# Patient Record
Sex: Male | Born: 1995
Health system: Southern US, Community
[De-identification: ages and names within clinical notes are randomized; demographics above are authoritative.]

## PROBLEM LIST (undated history)

## (undated) DIAGNOSIS — T7840XA Allergy, unspecified, initial encounter: Secondary | ICD-10-CM

## (undated) DIAGNOSIS — J302 Other seasonal allergic rhinitis: Secondary | ICD-10-CM

## (undated) HISTORY — PX: DENTAL SURGERY: SHX609

## (undated) HISTORY — DX: Allergy, unspecified, initial encounter: T78.40XA

## (undated) HISTORY — DX: Other seasonal allergic rhinitis: J30.2

## (undated) HISTORY — PX: TYMPANOSTOMY TUBE PLACEMENT: SHX32

---
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
## 2016-01-03 IMAGING — CR DG FINGER MIDDLE 2+V*R*
3 series · 3 of 3 positions shown · non-contrast
Comparison: None.

CLINICAL DATA: Finger laceration secondary to crush injury.

EXAM:
RIGHT MIDDLE FINGER 2+V

[x finger pa right]
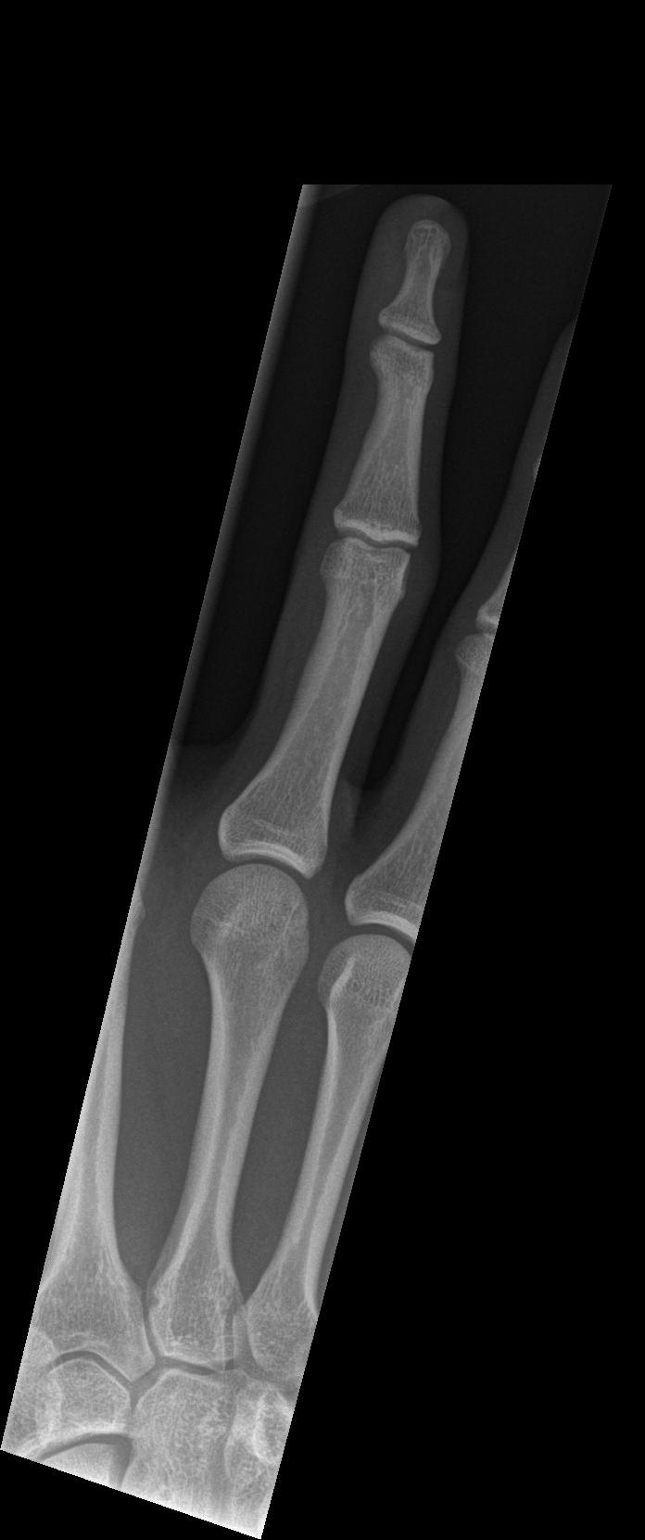

[x finger obl. right]
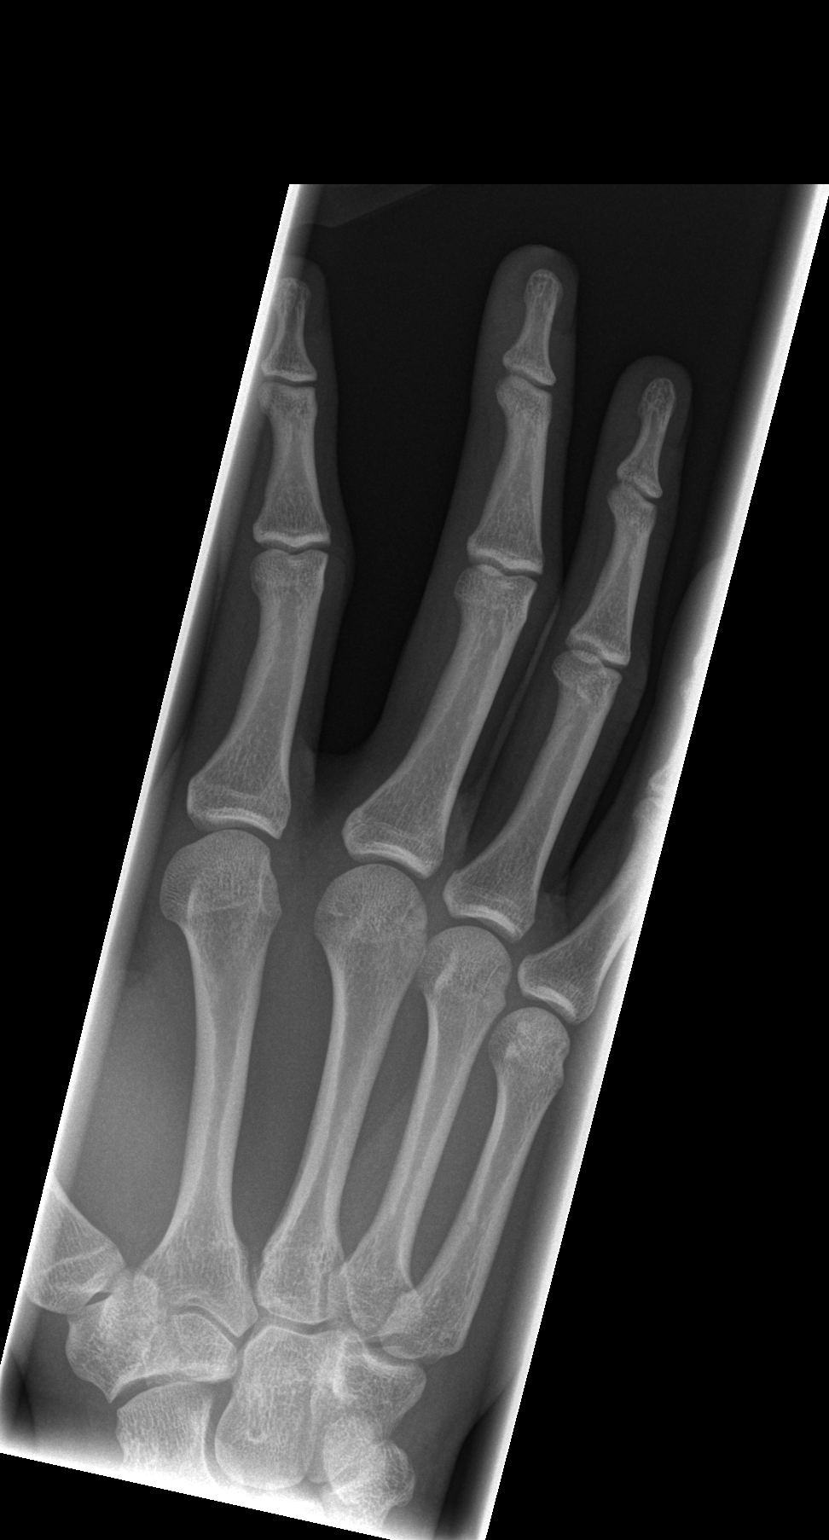

[x finger lateral right]
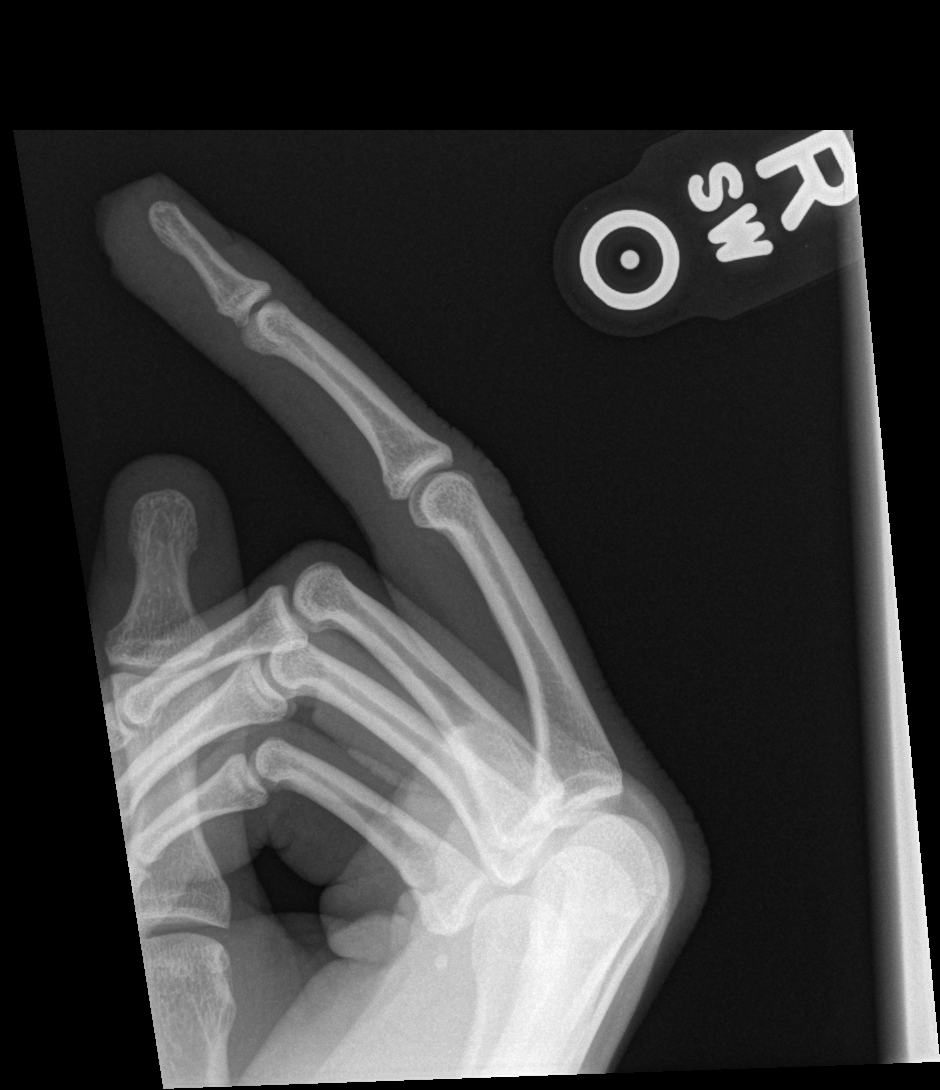

[3 of 3 positions shown; findings below may reference images not displayed]

FINDINGS: There is no fracture or dislocation or other osseous abnormality.
Laceration to the tip of the finger.
IMPRESSION: No osseous abnormality.

## 2016-02-06 ENCOUNTER — Ambulatory Visit: Payer: Self-pay | Admitting: Family Medicine

## 2016-02-23 ENCOUNTER — Ambulatory Visit (INDEPENDENT_AMBULATORY_CARE_PROVIDER_SITE_OTHER): Payer: Commercial Managed Care - HMO | Admitting: Family Medicine

## 2016-02-23 ENCOUNTER — Encounter: Payer: Self-pay | Admitting: Family Medicine

## 2016-02-23 VITALS — BP 110/70 | HR 83 | Temp 97.5°F | Ht 74.0 in | Wt 168.3 lb

## 2016-02-23 DIAGNOSIS — R0981 Nasal congestion: Secondary | ICD-10-CM | POA: Diagnosis not present

## 2016-02-23 DIAGNOSIS — Z7689 Persons encountering health services in other specified circumstances: Secondary | ICD-10-CM | POA: Diagnosis not present

## 2016-02-23 DIAGNOSIS — J309 Allergic rhinitis, unspecified: Secondary | ICD-10-CM | POA: Insufficient documentation

## 2016-02-23 DIAGNOSIS — J3089 Other allergic rhinitis: Secondary | ICD-10-CM

## 2016-02-23 MED ORDER — FLUTICASONE PROPIONATE 50 MCG/ACT NA SUSP: 2.0000 | Freq: Every day | NASAL | 1 refills | Status: DC

## 2016-02-23 NOTE — Patient Instructions (Signed)
Flonase 2 sprays each nostril daily for 1 month then 1 spray each nostril daily.  Allegra or zyrtec once daily.  Follow up yearly and as needed.

## 2016-02-23 NOTE — Progress Notes (Signed)
HPI:  Jacob BinderSamuel Chambers is here to establish care. Used to see Dr. Donnie Coffinubin.   Has the following chronic problems that require follow up and concerns today:  [place]. Mom and dad and brother come here to our practice. Studying textiles and business.  Nasal congestion: -started a few weeks ago -stuffy nose, cough for a few weeks ago and - given musinex, abx and inhaler a few weeks ago and cough but congestion persists -hx of seasonal allergies and dust mite allergy - usually take zyrtec - but not taking anything currently -no sick contacts -no sob, wheezing, fevers, malaise -no hx asthma  Plays intramural basketball.  ROS negative for unless reported above: fevers, unintentional weight loss, hearing or vision loss, chest pain, palpitations, struggling to breath, hemoptysis, melena, hematochezia, hematuria, falls, loc, si, thoughts of self harm  Past Medical History:  Diagnosis Date  . Seasonal allergies     Past Surgical History:  Procedure Laterality Date  . DENTAL SURGERY    . TYMPANOSTOMY TUBE PLACEMENT      Family History  Problem Relation Age of Onset  . Heart failure Mother   . Heart failure Father   . Hypertension Father   . Hyperlipidemia Father     Social History   Social History  . Marital status: Single    Spouse name: N/A  . Number of children: N/A  . Years of education: N/A   Social History Main Topics  . Smoking status: Never Smoker  . Smokeless tobacco: Never Used  . Alcohol use Yes     Comment: 4-5 drinks a few times per week  . Drug use:      Comment: weed  . Sexual activity: Yes    Birth control/ protection: Pill   Other Topics Concern  . None   Social History Narrative   Work or School:  Transport plannerull time student      Home Situation: lives at school and with parents when homw      Spiritual Beliefs: Christian      Lifestyle: regular exercise, diet is fair        Current Outpatient Prescriptions:  .   fluticasone (FLONASE) 50 MCG/ACT nasal spray, Place 2 sprays into both nostrils daily., Disp: 16 g, Rfl: 1  EXAM:  Vitals:   02/23/16 1146  BP: 110/70  Pulse: 83  Temp: 97.5 F (36.4 C)    Body mass index is 21.61 kg/m.  GENERAL: vitals reviewed and listed above, alert, oriented, appears well hydrated and in no acute distress  HEENT: atraumatic, conjunttiva clear, no obvious abnormalities on inspection of external nose and ears  NECK: no obvious masses on inspection  LUNGS: clear to auscultation bilaterally, no wheezes, rales or rhonchi, good air movement  CV: HRRR, no peripheral edema  MS: moves all extremities without noticeable abnormality  PSYCH: pleasant and cooperative, no obvious depression or anxiety  ASSESSMENT AND PLAN:  Discussed the following assessment and plan:  Encounter to establish care  Nasal congestion  Chronic nonseasonal allergic rhinitis due to other allergen -We reviewed the PMH, PSH, FH, SH, Meds and Allergies. -We provided refills for any medications we will prescribe as needed. -We addressed current concerns per orders and patient instructions. -We have asked for records for pertinent exams, studies, vaccines and notes from previous providers. -We have advised patient to follow up per instructions below. -flu shot offered -discussed safe sexual practices and advise condoms with all sexual encounters -advised of  risks with alcohol use and potential for adverse harm, questionable safe drinking limits -discussed risks with smoking  -Patient advised to return or notify a doctor immediately if symptoms worsen or persist or new concerns arise.  Patient Instructions  Flonase 2 sprays each nostril daily for 1 month then 1 spray each nostril daily.  Allegra or zyrtec once daily.  Follow up yearly and as needed.    Kriste BasqueKIM, Oluwadamilola Deliz R.

## 2016-02-23 NOTE — Progress Notes (Signed)
Pre visit review using our clinic review tool, if applicable. No additional management support is needed unless otherwise documented below in the visit note. 

## 2016-08-06 ENCOUNTER — Encounter: Payer: Self-pay | Admitting: Family Medicine

## 2016-08-06 ENCOUNTER — Ambulatory Visit (INDEPENDENT_AMBULATORY_CARE_PROVIDER_SITE_OTHER): Payer: Commercial Managed Care - HMO | Admitting: Family Medicine

## 2016-08-06 VITALS — BP 118/70 | HR 76 | Resp 12 | Ht 74.0 in | Wt 182.4 lb

## 2016-08-06 DIAGNOSIS — M25512 Pain in left shoulder: Secondary | ICD-10-CM | POA: Diagnosis not present

## 2016-08-06 DIAGNOSIS — M7582 Other shoulder lesions, left shoulder: Secondary | ICD-10-CM | POA: Diagnosis not present

## 2016-08-06 MED ORDER — NAPROXEN 500 MG PO TABS: 500.0000 mg | ORAL_TABLET | Freq: Two times a day (BID) | ORAL | 0 refills | Status: AC

## 2016-08-06 NOTE — Patient Instructions (Addendum)
[name] I have seen you today for an acute visit.  A few things to remember from today's visit:   Rotator cuff tendinitis, left  Acute pain of left shoulder - Plan: naproxen (NAPROSYN) 500 MG tablet     Shoulder Impingement Syndrome Rehab Ask your health care provider which exercises are safe for you. Do exercises exactly as told by your health care provider and adjust them as directed. It is normal to feel mild stretching, pulling, tightness, or discomfort as you do these exercises, but you should stop right away if you feel sudden pain or your pain gets worse.Do not begin these exercises until told by your health care provider. Stretching and range of motion exercise This exercise warms up your muscles and joints and improves the movement and flexibility of your shoulder. This exercise also helps to relieve pain and stiffness. Exercise A: Passive horizontal adduction  1. Sit or stand and pull your left / right elbow across your chest, toward your other shoulder. Stop when you feel a gentle stretch in the back of your shoulder and upper arm. ? Keep your arm at shoulder height. ? Keep your arm as close to your body as you comfortably can. 2. Hold for ______30____ seconds. 3. Slowly return to the starting position. Repeat _____3_____ times. Complete this exercise ______1____ times a day. Strengthening exercises These exercises build strength and endurance in your shoulder. Endurance is the ability to use your muscles for a long time, even after they get tired. Exercise B: External rotation, isometric 1. Stand or sit in a doorway, facing the door frame. 2. Bend your left / right elbow and place the back of your wrist against the door frame. Only your wrist should be touching the frame. Keep your upper arm at your side. 3. Gently press your wrist against the door frame, as if you are trying to push your arm away from your abdomen. ? Avoid shrugging your shoulder while you  press your hand against the door frame. Keep your shoulder blade tucked down toward the middle of your back. 4. Hold for ______30____ seconds. 5. Slowly release the tension, and relax your muscles completely before you do the exercise again. Repeat __________ times. Complete this exercise ____3______ times a day. Exercise C: Internal rotation, isometric  1. Stand or sit in a doorway, facing the door frame. 2. Bend your left / right elbow and place the inside of your wrist against the door frame. Only your wrist should be touching the frame. Keep your upper arm at your side. 3. Gently press your wrist against the door frame, as if you are trying to push your arm toward your abdomen. ? Avoid shrugging your shoulder while you press your hand against the door frame. Keep your shoulder blade tucked down toward the middle of your back. 4. Hold for __________ seconds. 5. Slowly release the tension, and relax your muscles completely before you do the exercise again. Repeat __________ times. Complete this exercise __________ times a day. Exercise D: Scapular protraction, supine  1. Lie on your back on a firm surface. Hold a __________ weight in your left / right hand. 2. Raise your left / right arm straight into the air so your hand is directly above your shoulder joint. 3. Push the weight into the air so your shoulder lifts off of the surface that you are lying on. Do not move your head, neck, or back. 4. Hold for _______15___ seconds. 5. Slowly return to the starting position.  Let your muscles relax completely before you repeat this exercise. Repeat ______2____ times. Complete this exercise __1________ times a day. Exercise E: Scapular retraction  1. Sit in a stable chair without armrests, or stand. 2. Secure an exercise band to a stable object in front of you so the band is at shoulder height. 3. Hold one end of the exercise band in each hand. Your palms should face down. 4. Squeeze your shoulder  blades together and move your elbows slightly behind you. Do not shrug your shoulders while you do this. 5. Hold for _____15_____ seconds. 6. Slowly return to the starting position. Repeat _______2___ times. Complete this exercise ____1______ times a day. Exercise F: Shoulder extension  1. Sit in a stable chair without armrests, or stand. 2. Secure an exercise band to a stable object in front of you where the band is above shoulder height. 3. Hold one end of the exercise band in each hand. 4. Straighten your elbows and lift your hands up to shoulder height. 5. Squeeze your shoulder blades together and pull your hands down to the sides of your thighs. Stop when your hands are straight down by your sides. Do not let your hands go behind your body. 6. Hold for _____15_____ seconds. 7. Slowly return to the starting position. Repeat ______2____ times. Complete this exercise ______1____ times a day. This information is not intended to replace advice given to you by your health care provider. Make sure you discuss any questions you have with your health care provider. Document Released: 02/12/2005 Document Revised: 10/20/2015 Document Reviewed: 01/15/2015 Elsevier Interactive Patient Education  [employer].        In general please monitor for signs of worsening symptoms and seek immediate medical attention if any concerning.  If symptoms are not resolved in 3-4 weeks you should schedule a follow up appointment with your doctor, before if needed.  Please be sure you have an appointment already scheduled with your PCP before you leave today.

## 2016-08-06 NOTE — Progress Notes (Signed)
HPI:   ACUTE VISIT:  Chief Complaint  Patient presents with  . Shoulder Pain    Jacob Chambers is a 21 y.o. male, who is here today complaining of 2 days of "little bit" of left shoulder pain, gradual onset after being in the pool all afternoon. He denies any injury or unusual physical activity.  No limitation of range of motion, he describes a "pop" sensation with movement, he feels "weird", but not pain elicited.   Shoulder Pain   The pain is present in the left shoulder. This is a new problem. The current episode started in the past 7 days. There has been no history of extremity trauma. The problem occurs constantly. The problem has been gradually improving. The pain is mild. Pertinent negatives include no fever, inability to bear weight, itching, joint locking, joint swelling, limited range of motion, numbness, stiffness or tingling. The symptoms are aggravated by activity. He has tried OTC pain meds and cold for the symptoms. The treatment provided mild relief. Family history does not include gout or rheumatoid arthritis. There is no history of diabetes, osteoarthritis or rheumatoid arthritis.    No prior Hx of shoulder pain. No other associated arthralgias. He has not noted skin lesions or associated respiratory symptoms.  He has tried local ice, topical Icy hot, and Ibuprofen 2 tabs today am.  Today he feels like pain is improved.  He is right handed.   Review of Systems  Constitutional: Negative for appetite change, chills, fatigue and fever.  HENT: Negative for mouth sores, sore throat and trouble swallowing.   Respiratory: Negative for cough, shortness of breath and wheezing.   Musculoskeletal: Negative for back pain, joint swelling, neck pain and stiffness.  Skin: Negative for itching, pallor and rash.  Neurological: Negative for tingling, weakness and numbness.      Current Outpatient Prescriptions on File Prior to Visit  Medication Sig Dispense  Refill  . fluticasone (FLONASE) 50 MCG/ACT nasal spray Place 2 sprays into both nostrils daily. 16 g 1   No current facility-administered medications on file prior to visit.      Past Medical History:  Diagnosis Date  . Allergy   . Seasonal allergies    Allergies  Allergen Reactions  . Peanut-Containing Drug Products   . Horse-Derived Products Rash    Social History   Social History  . Marital status: Single    Spouse name: N/A  . Number of children: N/A  . Years of education: N/A   Social History Main Topics  . Smoking status: Never Smoker  . Smokeless tobacco: Never Used  . Alcohol use Yes     Comment: 4-5 drinks a few times per week  . Drug use: Yes     Comment: weed  . Sexual activity: Yes    Birth control/ protection: Pill   Other Topics Concern  . None   Social History Narrative   Work or School:  Transport plannerull time student      Home Situation: lives at school and with parents when homw      Spiritual Beliefs: Christian      Lifestyle: regular exercise, diet is fair       Vitals:   08/06/16 1555  BP: 118/70  Pulse: 76  Resp: 12   Body mass index is 23.42 kg/m.   Physical Exam  Nursing note and vitals reviewed. Constitutional: He is oriented to person, place, and time. He appears well-developed and well-nourished. No distress.  HENT:  Head: Atraumatic.  Eyes: Conjunctivae are normal.  Cardiovascular: Normal rate and regular rhythm.   No murmur heard. Respiratory: Effort normal and breath sounds normal. No respiratory distress.  Musculoskeletal:  Left shoulder: No deformity, edema, or erythema appreciated.No muscle atrophy. Pain upon palpation posterior and lateral aspect of shoulder. Jacob Chambers' test mild pain elicited, drop arm rotator cuff test neg, empty can supraspinatus test mild pain elicited, cross body adduction test neg, lift-Off Subscapularis test pos. Active ROM mildly limited, normal passive ROM.Clicking sensation upon abduction but it  does not elicit pain.   Lymphadenopathy:    He has no cervical adenopathy.  Neurological: He is alert and oriented to person, place, and time. He has normal strength.  Skin: No rash noted. No erythema.  Psychiatric: He has a normal mood and affect.  Well groomed, good eye contact.     ASSESSMENT AND PLAN:    Jacob Chambers was seen today for shoulder pain.  Diagnoses and all orders for this visit:  Acute pain of left shoulder -     naproxen (NAPROSYN) 500 MG tablet; Take 1 tablet (500 mg total) by mouth 2 (two) times daily with a meal.  Rotator cuff tendinitis, left   We discussed possible etiologies. I don't think imaging is needed today. He would like to hold on PT referral, handout with some PT exercises given today. Naproxen 500 mg with food for 7-10 days.  Continue topical icy hot as needed OTC. Follow-up with PCP if not resolved in 3-4 weeks.     Return if symptoms worsen or fail to improve.      Jacob Feutz G. [place], MD  [facility]. [place] office.

## 2017-01-21 ENCOUNTER — Telehealth: Payer: Self-pay | Admitting: Family Medicine

## 2017-01-21 NOTE — Telephone Encounter (Signed)
I would need to see it to know whether we could remove or not. Sometimes we refer to dermatologist or surgeon depending on location, features, size, etc.

## 2017-01-21 NOTE — Telephone Encounter (Signed)
Copied from CRM [phone]. Topic: Referral - Question >> Jan 21, 2017  2:47 PM Jacob Chambers, Jacob Chambers wrote: Reason for CRM: mom states pt has a jelly bean sized knot on his back under his shoulder blade.  Pt is calling a cyst. Wants to know if Dr Selena BattenKim can remove or is this an issue another dr should address? Pt is only home from college 2 weeks and they would like to have this resolved in that 2 week window. Please advise

## 2017-01-22 NOTE — Telephone Encounter (Signed)
I called the pt and informed his mother of the message below.  An appt was scheduled for 02/28/2017 and I advised her I added extra time to the pts appt slot if Dr Selena BattenKim does decide to remove this; however it is up to the provider after she examines the area and if he is referred to a specialist or not and she agreed.

## 2017-02-28 ENCOUNTER — Ambulatory Visit: Payer: BLUE CROSS/BLUE SHIELD | Admitting: Family Medicine

## 2017-02-28 ENCOUNTER — Encounter: Payer: Self-pay | Admitting: Family Medicine

## 2017-02-28 VITALS — BP 98/56 | HR 69 | Temp 98.2°F | Ht 74.0 in | Wt 182.4 lb

## 2017-02-28 DIAGNOSIS — M222X2 Patellofemoral disorders, left knee: Secondary | ICD-10-CM | POA: Diagnosis not present

## 2017-02-28 DIAGNOSIS — R222 Localized swelling, mass and lump, trunk: Secondary | ICD-10-CM

## 2017-02-28 NOTE — Patient Instructions (Addendum)
BEFORE YOU LEAVE: -home exercises for Patellofem -? Flu shot   -We placed a referral for you as discussed for removal of the lump on the back. It usually takes about 1-2 weeks to process and schedule this referral. If you have not heard from us regarding this appointment in 2 weeks please contact our office.

## 2017-02-28 NOTE — Progress Notes (Signed)
HPI:  Jacob BinderSamuel Witt is a very pleasant 22 year old here for an acute visit regarding a mass on his back.  Been here for a long time, but he would like to have this removed.  He thought that perhaps I could remove this here in clinic today.  It is not really causing him any discomfort. Occasionally feels some popping or hears a cracking sound in his knees when he bends them.  Denies any pain, locking, catching or swelling.  Mother has patellofemoral syndrome.  ROS: See pertinent positives and negatives per HPI.  Past Medical History:  Diagnosis Date  . Allergy   . Seasonal allergies     Past Surgical History:  Procedure Laterality Date  . DENTAL SURGERY    . TYMPANOSTOMY TUBE PLACEMENT      Family History  Problem Relation Age of Onset  . Heart failure Mother   . Heart failure Father   . Hypertension Father   . Hyperlipidemia Father     Social History   Socioeconomic History  . Marital status: Single    Spouse name: None  . Number of children: None  . Years of education: None  . Highest education level: None  Social Needs  . Financial resource strain: None  . Food insecurity - worry: None  . Food insecurity - inability: None  . Transportation needs - medical: None  . Transportation needs - non-medical: None  Occupational History  . None  Tobacco Use  . Smoking status: Never Smoker  . Smokeless tobacco: Never Used  Substance and Sexual Activity  . Alcohol use: Yes    Comment: 4-5 drinks a few times per week  . Drug use: Yes    Comment: weed  . Sexual activity: Yes    Birth control/protection: Pill  Other Topics Concern  . None  Social History Narrative   Work or School:  Transport plannerull time student      Home Situation: lives at school and with parents when homw      Spiritual Beliefs: Christian      Lifestyle: regular exercise, diet is fair    No current outpatient medications on file.  EXAM:  Vitals:   02/28/17 1258  BP: (!) 98/56  Pulse: 69  Temp:  98.2 F (36.8 C)    Body mass index is 23.42 kg/m.  GENERAL: vitals reviewed and listed above, alert, oriented, appears well hydrated and in no acute distress  HEENT: atraumatic, conjunttiva clear, no obvious abnormalities on inspection of external nose and ears  NECK: no obvious masses on inspection  Skin: Approximately 2-3 cm in diameter subcutaneous soft and rubbery mass on the left mid back  MS: moves all extremities without noticeable abnormality, normal gait, mild J sign on the left, mild single-leg squat positive testing, mild VMO, otherwise normal evaluation of the knees are vascularly intact distally  PSYCH: pleasant and cooperative, no obvious depression or anxiety  ASSESSMENT AND PLAN:  Discussed the following assessment and plan:  Mass on back -Discussed potential etiologies with lipoma being most likely -Desires removal, will place referral, [place]   Patellofemoral pain syndrome of left knee -Home exercises, minimal symptoms currently -Follow-up as needed if symptoms persist or worsen  -Patient advised to return or notify a doctor immediately if symptoms worsen or persist or new concerns arise.  Patient Instructions  BEFORE YOU LEAVE: -home exercises for Patellofem -? Flu shot   -We placed a  referral for you as discussed for removal of the lump on the back. It usually takes about 1-2 weeks to process and schedule this referral. If you have not heard from [place] regarding this appointment in 2 weeks please contact our office.     Kriste Basque R., DO

## 2017-03-29 DIAGNOSIS — R5383 Other fatigue: Secondary | ICD-10-CM | POA: Diagnosis not present

## 2017-04-16 DIAGNOSIS — D171 Benign lipomatous neoplasm of skin and subcutaneous tissue of trunk: Secondary | ICD-10-CM | POA: Diagnosis not present
